# Patient Record
Sex: Male | Born: 1945
Health system: Southern US, Community
[De-identification: ages and names within clinical notes are randomized; demographics above are authoritative.]

## PROBLEM LIST (undated history)

## (undated) DIAGNOSIS — E119 Type 2 diabetes mellitus without complications: Secondary | ICD-10-CM

## (undated) DIAGNOSIS — I10 Essential (primary) hypertension: Secondary | ICD-10-CM

## (undated) DIAGNOSIS — E7800 Pure hypercholesterolemia, unspecified: Secondary | ICD-10-CM

---

## 2011-05-05 ENCOUNTER — Encounter: Payer: Self-pay | Admitting: Emergency Medicine

## 2011-05-05 ENCOUNTER — Emergency Department (HOSPITAL_BASED_OUTPATIENT_CLINIC_OR_DEPARTMENT_OTHER)
Admission: EM | Admit: 2011-05-05 | Discharge: 2011-05-06 | Disposition: A | Discharge status: Home / Self Care | Payer: Self-pay | Attending: Emergency Medicine | Admitting: Emergency Medicine

## 2011-05-05 DIAGNOSIS — X500XXA Overexertion from strenuous movement or load, initial encounter: Secondary | ICD-10-CM | POA: Insufficient documentation

## 2011-05-05 DIAGNOSIS — T63461A Toxic effect of venom of wasps, accidental (unintentional), initial encounter: Secondary | ICD-10-CM | POA: Insufficient documentation

## 2011-05-05 DIAGNOSIS — IMO0002 Reserved for concepts with insufficient information to code with codable children: Secondary | ICD-10-CM | POA: Insufficient documentation

## 2011-05-05 DIAGNOSIS — S39012A Strain of muscle, fascia and tendon of lower back, initial encounter: Secondary | ICD-10-CM

## 2011-05-05 DIAGNOSIS — T6391XA Toxic effect of contact with unspecified venomous animal, accidental (unintentional), initial encounter: Secondary | ICD-10-CM | POA: Insufficient documentation

## 2011-05-05 DIAGNOSIS — T63441A Toxic effect of venom of bees, accidental (unintentional), initial encounter: Secondary | ICD-10-CM

## 2011-05-05 MED ORDER — KETOROLAC TROMETHAMINE 60 MG/2ML IM SOLN
60.0000 mg | Freq: Once | INTRAMUSCULAR | Status: AC
  Administered 2011-05-05: 60 mg via INTRAMUSCULAR
  Filled 2011-05-05: qty 2

## 2011-05-05 NOTE — ED Provider Notes (Signed)
History     Chief Complaint  Patient presents with  . Back Pain  . Insect Bite   HPI Comments: Pt also states that he was stung by a bee yesterday on his stomach and it has been bothering him. It has been a little bit sore in his right lower abdomen and he's noticed some redness. He has not had any hives. He's not had any trouble with his breathing. He has no history of allergic reactions to bee stings in the past. He has not actually taken anything for his bee sting pain. He just got off work so he came to the ED.  The area on his stomach has been red.  No hives. Regarding his back pain, patient does have history of having problems with his back in the past. It has been years. He has not had any fevers or no focal deficits  Patient is a 65 y.o. male presenting with back pain. The history is provided by the patient.  Back Pain  The current episode started 6 to 12 hours ago. The problem occurs constantly. The problem has not changed since onset.The pain is associated with no known injury. The pain is present in the thoracic spine. The quality of the pain is described as stabbing. Radiates to: lower back. The pain is moderate. The symptoms are aggravated by bending, certain positions and twisting. Pertinent negatives include no chest pain, no fever, no numbness, no abdominal swelling, no dysuria, no paresthesias, no tingling and no weakness. He has tried nothing for the symptoms.    History reviewed. No pertinent past medical history.  History reviewed. No pertinent past surgical history.  History reviewed. No pertinent family history.  History  Substance Use Topics  . Smoking status: Current Everyday Smoker    Types: Cigars  . Smokeless tobacco: Not on file  . Alcohol Use: No      Review of Systems  Constitutional: Negative for fever.  Respiratory: Negative for apnea, cough, choking, chest tightness and shortness of breath.   Cardiovascular: Negative for chest pain.  Genitourinary:  Negative for dysuria.  Musculoskeletal: Positive for back pain.  Neurological: Negative for tingling, weakness, numbness and paresthesias.  All other systems reviewed and are negative.    Physical Exam  BP 130/63  Pulse 77  Temp(Src) 98.6 F (37 C) (Oral)  Resp 18  SpO2 97%  Physical Exam  Constitutional: He appears well-developed and well-nourished. No distress.  HENT:  Head: Normocephalic and atraumatic.  Right Ear: External ear normal.  Left Ear: External ear normal.  Eyes: Conjunctivae are normal. Right eye exhibits no discharge. Left eye exhibits no discharge. No scleral icterus.  Neck: Neck supple. No tracheal deviation present.  Cardiovascular: Normal rate, regular rhythm and intact distal pulses.   Pulmonary/Chest: Effort normal and breath sounds normal. No stridor. No respiratory distress. He has no wheezes. He has no rales.  Abdominal: Soft. Bowel sounds are normal. He exhibits no distension. There is no tenderness. There is no rebound and no guarding.  Musculoskeletal: He exhibits no edema.       Mild tenderness to palpation in the left paraspinal region of the thoracic and lumbar area. Is also some mild tenderness along the parascapular region. There is no erythema. No bony tenderness to palpation. Normal pulses in all extremities.  Neurological: He is alert. He has normal strength. No sensory deficit. Cranial nerve deficit:  no gross defecits noted. He exhibits normal muscle tone. He displays no seizure activity. Coordination normal.  Patient has normal strength sensation and sensation throughout his extremities.  Skin: Skin is warm and dry.       Small area of erythema in the right lower abdomen no lymphangitic streaking. No urticaria noted.  Psychiatric: He has a normal mood and affect.    ED Course  Procedures Injection of Toradol. MDM Patient with signs of localized inflammation from an insect sting. No evidence of allergic reaction or infection. He also  happens to have what appears to be musculoskeletal back pain. The pain increases with movement and palpation. There is no signs to suggest acute vascular emergency such as aortic dissection. Doubt cardiac or pulmonary etiology.      Celene Kras, MD 05/06/11 0001

## 2011-05-05 NOTE — ED Notes (Signed)
Pt c/o bee sting to RLQ abd yesterday. Mild swelling and redness to area noted. Pt has not taken any benadryl since incident. Pt also c/o back pain.

## 2011-05-06 MED ORDER — CYCLOBENZAPRINE HCL 10 MG PO TABS: 10.0000 mg | ORAL_TABLET | Freq: Two times a day (BID) | ORAL | Status: AC | PRN

## 2011-05-06 MED ORDER — NAPROXEN 500 MG PO TABS: 500.0000 mg | ORAL_TABLET | Freq: Two times a day (BID) | ORAL | Status: AC

## 2011-05-06 MED ORDER — DIPHENHYDRAMINE HCL 25 MG PO CAPS: 25.0000 mg | ORAL_CAPSULE | Freq: Four times a day (QID) | ORAL | Status: AC | PRN

## 2011-05-06 MED ORDER — HYDROCODONE-ACETAMINOPHEN 5-325 MG PO TABS: 1.0000 | ORAL_TABLET | Freq: Four times a day (QID) | ORAL | Status: AC | PRN

## 2014-11-15 ENCOUNTER — Emergency Department (HOSPITAL_BASED_OUTPATIENT_CLINIC_OR_DEPARTMENT_OTHER)
Admission: EM | Admit: 2014-11-15 | Discharge: 2014-11-15 | Disposition: A | Discharge status: Home / Self Care | Payer: Medicare Other | Attending: Emergency Medicine | Admitting: Emergency Medicine

## 2014-11-15 ENCOUNTER — Encounter (HOSPITAL_BASED_OUTPATIENT_CLINIC_OR_DEPARTMENT_OTHER): Payer: Self-pay | Admitting: *Deleted

## 2014-11-15 DIAGNOSIS — Z720 Tobacco use: Secondary | ICD-10-CM | POA: Insufficient documentation

## 2014-11-15 DIAGNOSIS — J029 Acute pharyngitis, unspecified: Secondary | ICD-10-CM | POA: Diagnosis present

## 2014-11-15 DIAGNOSIS — I10 Essential (primary) hypertension: Secondary | ICD-10-CM | POA: Insufficient documentation

## 2014-11-15 HISTORY — DX: Essential (primary) hypertension: I10

## 2014-11-15 LAB — RAPID STREP SCREEN (MED CTR MEBANE ONLY): STREPTOCOCCUS, GROUP A SCREEN (DIRECT): NEGATIVE

## 2014-11-15 MED ORDER — PREDNISONE 50 MG PO TABS: 50.0000 mg | ORAL_TABLET | Freq: Every day | ORAL | Status: AC

## 2014-11-15 MED ORDER — HYDROCODONE-ACETAMINOPHEN 7.5-325 MG/15ML PO SOLN: 15.0000 mL | ORAL | Status: AC | PRN

## 2014-11-15 MED ORDER — PENICILLIN V POTASSIUM 500 MG PO TABS: 500.0000 mg | ORAL_TABLET | Freq: Three times a day (TID) | ORAL | Status: AC

## 2014-11-15 NOTE — ED Provider Notes (Signed)
CSN: [id number]     Arrival date & time 11/15/14  1346 History   First MD Initiated Contact with Patient 11/15/14 1418     Chief Complaint  Patient presents with  . Sore Throat     (Consider location/radiation/quality/duration/timing/severity/associated sxs/prior Treatment) HPI  Pt presenting with c/o sore throat.  Pt states symptoms started last night at 2am.  He states he has also had some runny nose and mild cough.  No fever/chills.  He has had no difficulty swallowing or breathing.  He has pain with swallowing.  He states some coworkers have had URI symptoms.  There are no other associated systemic symptoms, there are no other alleviating or modifying factors.   Past Medical History  Diagnosis Date  . Hypertension    History reviewed. No pertinent past surgical history. No family history on file. History  Substance Use Topics  . Smoking status: Current Some Day Smoker    Types: Cigars  . Smokeless tobacco: Never Used  . Alcohol Use: Yes     Comment: occasional    Review of Systems  ROS reviewed and all otherwise negative except for mentioned in HPI    Allergies  Review of patient's allergies indicates no known allergies.  Home Medications   Prior to Admission medications   Medication Sig Start Date End Date Taking? Authorizing Provider  HYDROcodone-acetaminophen (HYCET) 7.5-325 mg/15 ml solution Take 15 mLs by mouth every 4 (four) hours as needed for moderate pain or severe pain. 11/15/14   Ethelda ChickMartha K Linker, MD  penicillin v potassium (VEETID) 500 MG tablet Take 1 tablet (500 mg total) by mouth 3 (three) times daily. 11/15/14   Ethelda ChickMartha K Linker, MD  predniSONE (DELTASONE) 50 MG tablet Take 1 tablet (50 mg total) by mouth daily. 11/15/14   Ethelda ChickMartha K Linker, MD   BP 147/69 mmHg  Pulse 79  Temp(Src) 98.5 F (36.9 C) (Oral)  Resp 16  Ht 5\' 8"  (1.727 m)  Wt 187 lb (84.823 kg)  BMI 28.44 kg/m2  SpO2 99%  Vitals reviewed Physical Exam  Physical Examination: General  appearance - alert, well appearing, and in no distress Mental status - alert, oriented to person, place, and time Eyes - no conjunctival injection, no scleral icterus Mouth - OP with moderate erythema, no exudate, palate symmetric, uvula midline Chest - clear to auscultation, no wheezes, rales or rhonchi, symmetric air entry Heart - normal rate, regular rhythm, normal S1, S2, no murmurs, rubs, clicks or gallops Abdomen - soft, nontender, nondistended, no masses or organomegaly Extremities - peripheral pulses normal, no pedal edema, no clubbing or cyanosis Skin - normal coloration and turgor, no rashes  ED Course  Procedures (including critical care time) Labs Review Labs Reviewed  RAPID STREP SCREEN  CULTURE, GROUP A STREP    Imaging Review No results found.   EKG Interpretation None      MDM   Final diagnoses:  Pharyngitis   Pt presenting with c/o sore throat, runny nose and mild cough.  He has some hot potato voice and signficant pain on exam.  Will treat with penicillin- rapid strep negative, throat culture pending.  Also added steroids, given rx for hycet as well.   Patient is overall nontoxic and well hydrated in appearance.  Discharged with strict return precautions.  Pt agreeable with plan.     Ethelda ChickMartha K Linker, MD 11/15/14 [phone]

## 2014-11-15 NOTE — ED Notes (Signed)
Sore throat x 1 day 

## 2014-11-15 NOTE — Discharge Instructions (Signed)
Return to the ED with any concerns including difficulty breathing or swallowing, fever/chills, vomiting and not able to keep down liquids or antibiotics, decreased level of alertness/lethargy, or any other alarming symptoms

## 2014-11-17 LAB — CULTURE, GROUP A STREP

## 2015-01-26 ENCOUNTER — Emergency Department (HOSPITAL_BASED_OUTPATIENT_CLINIC_OR_DEPARTMENT_OTHER)
Admission: EM | Admit: 2015-01-26 | Discharge: 2015-01-26 | Disposition: A | Discharge status: Home / Self Care | Payer: Medicare Other | Attending: Emergency Medicine | Admitting: Emergency Medicine

## 2015-01-26 ENCOUNTER — Emergency Department (HOSPITAL_BASED_OUTPATIENT_CLINIC_OR_DEPARTMENT_OTHER): Payer: Medicare Other

## 2015-01-26 ENCOUNTER — Encounter (HOSPITAL_BASED_OUTPATIENT_CLINIC_OR_DEPARTMENT_OTHER): Payer: Self-pay | Admitting: *Deleted

## 2015-01-26 DIAGNOSIS — Z792 Long term (current) use of antibiotics: Secondary | ICD-10-CM | POA: Diagnosis not present

## 2015-01-26 DIAGNOSIS — R0789 Other chest pain: Secondary | ICD-10-CM | POA: Diagnosis not present

## 2015-01-26 DIAGNOSIS — Z79899 Other long term (current) drug therapy: Secondary | ICD-10-CM | POA: Diagnosis not present

## 2015-01-26 DIAGNOSIS — R079 Chest pain, unspecified: Secondary | ICD-10-CM | POA: Insufficient documentation

## 2015-01-26 DIAGNOSIS — M25512 Pain in left shoulder: Secondary | ICD-10-CM | POA: Diagnosis not present

## 2015-01-26 DIAGNOSIS — R21 Rash and other nonspecific skin eruption: Secondary | ICD-10-CM | POA: Diagnosis not present

## 2015-01-26 DIAGNOSIS — Z7952 Long term (current) use of systemic steroids: Secondary | ICD-10-CM | POA: Insufficient documentation

## 2015-01-26 DIAGNOSIS — I10 Essential (primary) hypertension: Secondary | ICD-10-CM | POA: Diagnosis not present

## 2015-01-26 LAB — BASIC METABOLIC PANEL
ANION GAP: 7 (ref 5–15)
BUN: 18 mg/dL (ref 6–23)
CALCIUM: 9.1 mg/dL (ref 8.4–10.5)
CO2: 28 mmol/L (ref 19–32)
Chloride: 102 mmol/L (ref 96–112)
Creatinine, Ser: 1.06 mg/dL (ref 0.50–1.35)
GFR, EST AFRICAN AMERICAN: 82 mL/min — AB (ref >90)
GFR, EST NON AFRICAN AMERICAN: 71 mL/min — AB (ref >90)
Glucose, Bld: 104 mg/dL — ABNORMAL HIGH (ref 70–99)
POTASSIUM: 3.9 mmol/L (ref 3.5–5.1)
SODIUM: 137 mmol/L (ref 135–145)

## 2015-01-26 LAB — CBC
HEMATOCRIT: 39.8 % (ref 39.0–52.0)
HEMOGLOBIN: 14 g/dL (ref 13.0–17.0)
MCH: 27.7 pg (ref 26.0–34.0)
MCHC: 35.2 g/dL (ref 30.0–36.0)
MCV: 78.8 fL (ref 78.0–100.0)
Platelets: 226 10*3/uL (ref 150–400)
RBC: 5.05 MIL/uL (ref 4.22–5.81)
RDW: 14.7 % (ref 11.5–15.5)
WBC: 7.2 10*3/uL (ref 4.0–10.5)

## 2015-01-26 LAB — TROPONIN I: Troponin I: <0.03 ng/mL (ref <0.031)

## 2015-01-26 MED ORDER — ASPIRIN 81 MG PO CHEW
324.0000 mg | CHEWABLE_TABLET | Freq: Once | ORAL | Status: AC
  Administered 2015-01-26: 324 mg via ORAL
  Filled 2015-01-26: qty 4

## 2015-01-26 MED ORDER — HYDROCODONE-ACETAMINOPHEN 5-325 MG PO TABS: 1.0000 | ORAL_TABLET | Freq: Four times a day (QID) | ORAL | Status: AC | PRN

## 2015-01-26 NOTE — ED Provider Notes (Signed)
CSN: [id number]     Arrival date & time 01/26/15  1526 History  This chart was scribed for Vanetta MuldersScott Markeese Boyajian, MD by Tonye RoyaltyJoshua Chen, ED Scribe. This patient was seen in room MH06/MH06 and the patient's care was started at 4:15 PM.    Chief Complaint  Patient presents with  . Chest Pain   Patient is a 69 y.o. male presenting with chest pain. The history is provided by the patient. No language interpreter was used.  Chest Pain Pain location:  L chest Pain quality: aching   Pain radiates to:  L shoulder Pain radiates to the back: no   Pain severity:  Moderate Onset quality:  Sudden Duration:  8 hours Timing:  Constant Progression:  Unchanged Chronicity:  New Context: at rest   Relieved by:  None tried Exacerbated by: movement of left arm. Ineffective treatments:  None tried Associated symptoms: back pain (baseline)   Associated symptoms: no abdominal pain, no cough, no fever, no headache, no nausea, no shortness of breath and not vomiting   Risk factors: hypertension and male sex     HPI Comments: Sharene ButtersJerry Stennis is a 69 y.o. male with history of HTN who presents to the Emergency Department complaining of left-sided chest pain radiating to left shoulder with onset at 0830 this morning. He states pain has been constant since it began. He describes the pain as aching and rates it at 7.5/10, 8.5/10 at worst. He states pain is worse with movement of left arm, especially when lifting the arm or using steering wheel. He denies any discomfort yesterday. 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 He states that a few weeks ago, he was having intermittent left-sided chest pain "in his heart" that would resolve spontaneously, he did not see his doctor for this. He states today's pain feels "totally different." He states pain at that time did not change with movement of his arm. He reports history of cardiac problems but denies prior MI. He uses baby  ASA every day and also uses HTN medication. He denise SOB, nausea, or vomiting.  PCP: Patria ManeGASSEMI, MIKE, MD with [facility]  Past Medical History  Diagnosis Date  . Hypertension    History reviewed. No pertinent past surgical history. No family history on file. History  Substance Use Topics  . Smoking status: Current Some Day Smoker    Types: Cigars  . Smokeless tobacco: Never Used  . Alcohol Use: Yes     Comment: occasional    Review of Systems  Constitutional: Negative for fever and chills.  HENT: Negative for rhinorrhea and sore throat.   Eyes: Negative for visual disturbance.  Respiratory: Negative for cough and shortness of breath.   Cardiovascular: Positive for chest pain. Negative for leg swelling.  Gastrointestinal: Negative for nausea, vomiting, abdominal pain and diarrhea.  Genitourinary: Negative for dysuria.  Musculoskeletal: Positive for back pain (baseline).  Skin: Positive for rash (evaluated by PCP and prescribed a cream).  Neurological: Negative for headaches.  Hematological: Does not bruise/bleed easily.  Psychiatric/Behavioral: Negative for confusion.      Allergies  Review of patient's allergies indicates no known allergies.  Home Medications   Prior to Admission medications   Medication Sig Start Date End Date Taking? Authorizing Provider  oxaprozin (DAYPRO) 600 MG tablet Take by mouth daily.   Yes Historical Provider, MD  HYDROcodone-acetaminophen (HYCET) 7.5-325 mg/15 ml solution Take 15 mLs by mouth every 4 (four)  hours as needed for moderate pain or severe pain. 11/15/14   Jerelyn Armie Moren, MD  HYDROcodone-acetaminophen (NORCO/VICODIN) 5-325 MG per tablet Take 1-2 tablets by mouth every 6 (six) hours as needed for moderate pain. 01/26/15   Vanetta Mulders, MD  penicillin v potassium (VEETID) 500 MG tablet Take 1 tablet (500 mg total) by mouth 3 (three) times daily. 11/15/14   Jerelyn Jaxyn Mestas, MD  predniSONE (DELTASONE) 50 MG tablet Take 1 tablet  (50 mg total) by mouth daily. 11/15/14   Jerelyn Korey Prashad, MD   BP 119/60 mmHg  Pulse 67  Temp(Src) 98.6 F (37 C) (Oral)  Resp 13  Ht  (1.727 m)  Wt 185 lb (83.915 kg)  BMI 28.14 kg/m2  SpO2 98% Physical Exam  Constitutional: He is oriented to person, place, and time. He appears well-developed and well-nourished.  HENT:  Head: Normocephalic and atraumatic.  Moist mucous membranes  Eyes: Conjunctivae and EOM are normal. Pupils are equal, round, and reactive to light. No scleral icterus.  Neck: Normal range of motion. Neck supple.  Cardiovascular: Normal rate and regular rhythm.   Pulses:      Radial pulses are 2+ on the left side.  Pulmonary/Chest: Effort normal and breath sounds normal. No respiratory distress. He has no wheezes. He has no rales.  Lungs clear bilaterally  Abdominal: Soft. Bowel sounds are normal. There is no tenderness.  Musculoskeletal: Normal range of motion. He exhibits no edema (no swelling in ankles).  Pain with manipulation of left arm  Neurological: He is alert and oriented to person, place, and time. No cranial nerve deficit. He exhibits normal muscle tone. Coordination normal.  Skin: Skin is warm and dry.  Psychiatric: He has a normal mood and affect.  Nursing note and vitals reviewed.   ED Course  Procedures (including critical care time)  DIAGNOSTIC STUDIES: Oxygen Saturation is 99% on room air, normal by my interpretation.    COORDINATION OF CARE: 4:28 PM Discussed treatment plan with patient at beside, including cardiac workup and x-ray of shoulder. The patient agrees with the plan and has no further questions at this time.   Labs Review Labs Reviewed  BASIC METABOLIC PANEL - Abnormal; Notable for the following:    Glucose, Bld 104 (*)    GFR calc non Af Amer 71 (*)    GFR calc Af Amer 82 (*)    All other components within normal limits  CBC  TROPONIN I   Results for orders placed or performed during the hospital encounter of  01/26/15  CBC  Result Value Ref Range   WBC 7.2 4.0 - 10.5 K/uL   RBC 5.05 4.22 - 5.81 MIL/uL   Hemoglobin 14.0 13.0 - 17.0 g/dL   HCT 16.1 09.6 - 04.5 %   MCV 78.8 78.0 - 100.0 fL   MCH 27.7 26.0 - 34.0 pg   MCHC 35.2 30.0 - 36.0 g/dL   RDW 40.9 81.1 - 91.4 %   Platelets 226 150 - 400 K/uL  Basic metabolic panel  Result Value Ref Range   Sodium 137 135 - 145 mmol/L   Potassium 3.9 3.5 - 5.1 mmol/L   Chloride 102 96 - 112 mmol/L   CO2 28 19 - 32 mmol/L   Glucose, Bld 104 (H) 70 - 99 mg/dL   BUN 18 6 - 23 mg/dL   Creatinine, Ser 7.82 0.50 - 1.35 mg/dL   Calcium 9.1 8.4 - 95.6 mg/dL   GFR calc non Af Amer 71 (L) >90  mL/min   GFR calc Af Amer 82 (L) >90 mL/min   Anion gap 7 5 - 15  Troponin I ([facility])  Result Value Ref Range   Troponin I <0.03 <0.031 ng/mL     Imaging Review Dg Chest 2 View  01/26/2015   CLINICAL DATA:  LEFT side chest and shoulder pain  EXAM: CHEST  2 VIEW  COMPARISON:  None  FINDINGS: Normal heart size, mediastinal contours and pulmonary vascularity.  Lungs clear.  No pleural effusion or pneumothorax.  Scattered endplate spur formation lower thoracic spine.  IMPRESSION: No acute abnormalities.   Electronically Signed   By: Ulyses Southward M.D.   On: 01/26/2015 16:52   Dg Shoulder Left  01/26/2015   CLINICAL DATA:  Left-sided chest pain and shoulder pain without trauma. Initial encounter.  EXAM: LEFT SHOULDER - 2+ VIEW  COMPARISON:  None.  FINDINGS: Degenerative irregularity involves the acromioclavicular joint, moderate. Visualized portion of the left hemithorax is normal. No acute fracture or dislocation.  IMPRESSION: Degenerative change, without acute osseous finding.   Electronically Signed   By: Jeronimo Greaves M.D.   On: 01/26/2015 16:53     EKG Interpretation   Date/Time:  Monday January 26 2015 15:34:23 EDT Ventricular Rate:  61 PR Interval:  176 QRS Duration: 150 QT Interval:  408 QTC Calculation: 410 R Axis:   -80 Text Interpretation:  Normal sinus rhythm  Left axis deviation Right bundle  branch block Abnormal ECG No previous ECGs available Confirmed by  Coralynn Gaona  MD, Toney Difatta [phone]) on 01/26/2015 3:59:56 PM      MDM   Final diagnoses:  Shoulder pain, acute, left    Patient with the significant discomfort in the left shoulder with range of motion increased pain when trying to raise his left arm above his head. This we consistent with some sort of rotator cuff type injury. Does not seem to be cardiac in nature. Patient week ago had some on and off chest pain troponin was negative today also today's chest pain started about 8:30 in the morning. As stated this is not consistent with acute cardiac pain. More of rotator cuff type injury. Patient treated with sling follow-up sports medicine. Patient will continue baby aspirin a day.  Patient will return for any new or worse symptoms.  I personally performed the services described in this documentation, which was scribed in my presence. The recorded information has been reviewed and is accurate.    Vanetta Mulders, MD 01/26/15 1724

## 2015-01-26 NOTE — ED Notes (Signed)
Left sided chest pain. Aching in his left arm.

## 2015-01-26 NOTE — Discharge Instructions (Signed)
Symptoms seem to be consistent with a rotator cuff injury to the left shoulder. Does not seem to be consistent with cardiac chest pain. Follow-up of with sports medicine upstairs information provided. Take pain medicine as needed. Use sling for comfort. Return for any new or worse symptoms. Continue take an baby aspirin a day.

## 2015-01-26 NOTE — ED Notes (Signed)
Will apply sling once Pt is ready to be discharged and RN Marva informed

## 2016-02-04 ENCOUNTER — Emergency Department (HOSPITAL_BASED_OUTPATIENT_CLINIC_OR_DEPARTMENT_OTHER)
Admission: EM | Admit: 2016-02-04 | Discharge: 2016-02-05 | Disposition: A | Discharge status: Home / Self Care | Payer: Medicare Other | Attending: Emergency Medicine | Admitting: Emergency Medicine

## 2016-02-04 ENCOUNTER — Encounter (HOSPITAL_BASED_OUTPATIENT_CLINIC_OR_DEPARTMENT_OTHER): Payer: Self-pay | Admitting: *Deleted

## 2016-02-04 ENCOUNTER — Emergency Department (HOSPITAL_BASED_OUTPATIENT_CLINIC_OR_DEPARTMENT_OTHER): Payer: Medicare Other

## 2016-02-04 DIAGNOSIS — F17210 Nicotine dependence, cigarettes, uncomplicated: Secondary | ICD-10-CM | POA: Diagnosis not present

## 2016-02-04 DIAGNOSIS — Z79899 Other long term (current) drug therapy: Secondary | ICD-10-CM | POA: Insufficient documentation

## 2016-02-04 DIAGNOSIS — I10 Essential (primary) hypertension: Secondary | ICD-10-CM | POA: Diagnosis not present

## 2016-02-04 DIAGNOSIS — Z792 Long term (current) use of antibiotics: Secondary | ICD-10-CM | POA: Insufficient documentation

## 2016-02-04 DIAGNOSIS — Z7952 Long term (current) use of systemic steroids: Secondary | ICD-10-CM | POA: Diagnosis not present

## 2016-02-04 DIAGNOSIS — K5900 Constipation, unspecified: Secondary | ICD-10-CM | POA: Insufficient documentation

## 2016-02-04 NOTE — ED Notes (Signed)
C/o constipation since Tuesday  States has had some small bm,

## 2016-02-04 NOTE — ED Notes (Signed)
Constipation. Abdominal pain. He took a laxative earlier today with no relief.

## 2016-02-05 MED ORDER — FLEET ENEMA 7-19 GM/118ML RE ENEM: 1.0000 | ENEMA | Freq: Once | RECTAL | Status: AC

## 2016-02-05 MED ORDER — GLYCERIN (LAXATIVE) 2.1 G RE SUPP
1.0000 | Freq: Once | RECTAL | Status: AC
  Administered 2016-02-05: 1 via RECTAL
  Filled 2016-02-05: qty 1

## 2016-02-05 MED ORDER — POLYETHYLENE GLYCOL 3350 17 G PO PACK: 17.0000 g | PACK | Freq: Three times a day (TID) | ORAL | Status: AC | PRN

## 2016-02-05 NOTE — Discharge Instructions (Signed)
Constipation, Adult °Constipation is when a person has fewer than three bowel movements a week, has difficulty having a bowel movement, or has stools that are dry, hard, or larger than normal. As people grow older, constipation is more common. A low-fiber diet, not taking in enough fluids, and taking certain medicines may make constipation worse.  °CAUSES  °· Certain medicines, such as antidepressants, pain medicine, iron supplements, antacids, and water pills.   °· Certain diseases, such as diabetes, irritable bowel syndrome (IBS), thyroid disease, or depression.   °· Not drinking enough water.   °· Not eating enough fiber-rich foods.   °· Stress or travel.   °· Lack of physical activity or exercise.   °· Ignoring the urge to have a bowel movement.   °· Using laxatives too much.   °SIGNS AND SYMPTOMS  °· Having fewer than three bowel movements a week.   °· Straining to have a bowel movement.   °· Having stools that are hard, dry, or larger than normal.   °· Feeling full or bloated.   °· Pain in the lower abdomen.   °· Not feeling relief after having a bowel movement.   °DIAGNOSIS  °Your health care provider will take a medical history and perform a physical exam. Further testing may be done for severe constipation. Some tests may include: °· A barium enema X-ray to examine your rectum, colon, and, sometimes, your small intestine.   °· A sigmoidoscopy to examine your lower colon.   °· A colonoscopy to examine your entire colon. °TREATMENT  °Treatment will depend on the severity of your constipation and what is causing it. Some dietary treatments include drinking more fluids and eating more fiber-rich foods. Lifestyle treatments may include regular exercise. If these diet and lifestyle recommendations do not help, your health care provider may recommend taking over-the-counter laxative medicines to help you have bowel movements. Prescription medicines may be prescribed if over-the-counter medicines do not work.    °HOME CARE INSTRUCTIONS  °· Eat foods that have a lot of fiber, such as fruits, vegetables, whole grains, and beans. °· Limit foods high in fat and processed sugars, such as french fries, hamburgers, cookies, candies, and soda.   °· A fiber supplement may be added to your diet if you cannot get enough fiber from foods.   °· Drink enough fluids to keep your urine clear or pale yellow.   °· Exercise regularly or as directed by your health care provider.   °· Go to the restroom when you have the urge to go. Do not hold it.   °· Only take over-the-counter or prescription medicines as directed by your health care provider. Do not take other medicines for constipation without talking to your health care provider first.   °SEEK IMMEDIATE MEDICAL CARE IF:  °· You have bright red blood in your stool.   °· Your constipation lasts for more than 4 days or gets worse.   °· You have abdominal or rectal pain.   °· You have thin, pencil-like stools.   °· You have unexplained weight loss. °MAKE SURE YOU:  °· Understand these instructions. °· Will watch your condition. °· Will get help right away if you are not doing well or get worse. °  °This information is not intended to replace advice given to you by your health care provider. Make sure you discuss any questions you have with your health care provider. °  °Document Released: 07/01/2004 Document Revised: 10/24/2014 Document Reviewed: 07/15/2013 °Elsevier Interactive Patient Education ©2016 [employer] ° °High-Fiber Diet °Fiber, also called dietary fiber, is a type of carbohydrate found in fruits, vegetables, whole grains, and   beans. A high-fiber diet can have many health benefits. Your health care provider may recommend a high-fiber diet to help: °· Prevent constipation. Fiber can make your bowel movements more regular. °· Lower your cholesterol. °· Relieve hemorrhoids, uncomplicated diverticulosis, or irritable bowel syndrome. °· Prevent overeating as part of a weight-loss  plan. °· Prevent heart disease, type 2 diabetes, and certain cancers. °WHAT IS MY PLAN? °The recommended daily intake of fiber includes: °· 38 grams for men under age 50. °· 30 grams for men over age 50. °· 25 grams for women under age 50. °· 21 grams for women over age 50. °You can get the recommended daily intake of dietary fiber by eating a variety of fruits, vegetables, grains, and beans. Your health care provider may also recommend a fiber supplement if it is not possible to get enough fiber through your diet. °WHAT DO I NEED TO KNOW ABOUT A HIGH-FIBER DIET? °· Fiber supplements have not been widely studied for their effectiveness, so it is better to get fiber through food sources. °· Always check the fiber content on the nutrition facts label of any prepackaged food. Look for foods that contain at least 5 grams of fiber per serving. °· Ask your dietitian if you have questions about specific foods that are related to your condition, especially if those foods are not listed in the following section. °· Increase your daily fiber consumption gradually. Increasing your intake of dietary fiber too quickly may cause bloating, cramping, or gas. °· Drink plenty of water. Water helps you to digest fiber. °WHAT FOODS CAN I EAT? °Grains °Whole-grain breads. Multigrain cereal. Oats and oatmeal. Brown rice. Barley. Bulgur wheat. Millet. Bran muffins. Popcorn. Rye wafer crackers. °Vegetables °Sweet potatoes. Spinach. Kale. Artichokes. Cabbage. Broccoli. Green peas. Carrots. Squash. °Fruits °Berries. Pears. Apples. Oranges. Avocados. Prunes and raisins. Dried figs. °Meats and Other Protein Sources °Navy, kidney, pinto, and soy beans. Split peas. Lentils. Nuts and seeds. °Dairy °Fiber-fortified yogurt. °Beverages °Fiber-fortified soy milk. Fiber-fortified orange juice. °Other °Fiber bars. °The items listed above may not be a complete list of recommended foods or beverages. Contact your dietitian for more options. °WHAT FOODS  ARE NOT RECOMMENDED? °Grains °White bread. Pasta made with refined flour. White rice. °Vegetables °Fried potatoes. Canned vegetables. Well-cooked vegetables.  °Fruits °Fruit juice. Cooked, strained fruit. °Meats and Other Protein Sources °Fatty cuts of meat. Fried poultry or fried fish. °Dairy °Milk. Yogurt. Cream cheese. Sour cream. °Beverages °Soft drinks. °Other °Cakes and pastries. Butter and oils. °The items listed above may not be a complete list of foods and beverages to avoid. Contact your dietitian for more information. °WHAT ARE SOME TIPS FOR INCLUDING HIGH-FIBER FOODS IN MY DIET? °· Eat a wide variety of high-fiber foods. °· Make sure that half of all grains consumed each day are whole grains. °· Replace breads and cereals made from refined flour or white flour with whole-grain breads and cereals. °· Replace white rice with brown rice, bulgur wheat, or millet. °· Start the day with a breakfast that is high in fiber, such as a cereal that contains at least 5 grams of fiber per serving. °· Use beans in place of meat in soups, salads, or pasta. °· Eat high-fiber snacks, such as berries, raw vegetables, nuts, or popcorn. °  °This information is not intended to replace advice given to you by your health care provider. Make sure you discuss any questions you have with your health care provider. °  °Document Released: 10/03/2005 Document Revised: 10/24/2014 Document   Reviewed: 03/18/2014 °Elsevier Interactive Patient Education ©2016 [employer]. ° °

## 2016-02-05 NOTE — ED Provider Notes (Signed)
CSN: [id number]     Arrival date & time 02/04/16  2227 History   First MD Initiated Contact with Patient 02/04/16 2323     Chief Complaint  Patient presents with  . Constipation     (Consider location/radiation/quality/duration/timing/severity/associated sxs/prior Treatment) HPI  Ronald Adams Is a 70 year old male who presents emergency Department with chief complaint of constipation. Patient states that he has not made a bowel movement in the past 3 days. He complains of urgency to defecate but denies severe abdominal pain. He states that he has been making very small bowel movements. He took mag citrate around 1 PM today without successful bowel movement. Patient states he also drank milk. She states "runs right through me." Denies history of abdominal surgeries. She denies nausea, vomiting. He denies fevers or chills. Denies urinary symptoms, chest pain or shortness of breath. Past Medical History  Diagnosis Date  . Hypertension    History reviewed. No pertinent past surgical history. No family history on file. Social History  Substance Use Topics  . Smoking status: Current Some Day Smoker    Types: Cigars  . Smokeless tobacco: Never Used  . Alcohol Use: Yes     Comment: occasional    Review of Systems  Ten systems reviewed and are negative for acute change, except as noted in the HPI.    Allergies  Review of patient's allergies indicates no known allergies.  Home Medications   Prior to Admission medications   Medication Sig Start Date End Date Taking? Authorizing Provider  HYDROcodone-acetaminophen (HYCET) 7.5-325 mg/15 ml solution Take 15 mLs by mouth every 4 (four) hours as needed for moderate pain or severe pain. 11/15/14   Jerelyn ScottMartha Linker, MD  HYDROcodone-acetaminophen (NORCO/VICODIN) 5-325 MG per tablet Take 1-2 tablets by mouth every 6 (six) hours as needed for moderate pain. 01/26/15   Vanetta MuldersScott Zackowski, MD  oxaprozin (DAYPRO) 600 MG tablet Take by mouth daily.     Historical Provider, MD  penicillin v potassium (VEETID) 500 MG tablet Take 1 tablet (500 mg total) by mouth 3 (three) times daily. 11/15/14   Jerelyn ScottMartha Linker, MD  predniSONE (DELTASONE) 50 MG tablet Take 1 tablet (50 mg total) by mouth daily. 11/15/14   Jerelyn ScottMartha Linker, MD   BP 147/81 mmHg  Pulse 84  Temp(Src) 98.7 F (37.1 C) (Oral)  Resp 20  Ht 5\' 8"  (1.727 m)  Wt 83.915 kg  BMI 28.14 kg/m2  SpO2 98% Physical Exam Physical Exam  Nursing note and vitals reviewed. Constitutional: He appears well-developed and well-nourished. No distress.  HENT:  Head: Normocephalic and atraumatic.  Eyes: Conjunctivae normal are normal. No scleral icterus.  Neck: Normal range of motion. Neck supple.  Cardiovascular: Normal rate, regular rhythm and normal heart sounds.   Pulmonary/Chest: Effort normal and breath sounds normal. No respiratory distress.  Abdominal: Soft. There is no tenderness.  Musculoskeletal: He exhibits no edema.  Neurological: He is alert.  Skin: Skin is warm and dry. He is not diaphoretic.  Psychiatric: His behavior is normal.  Rectal: Digital Rectal Exam reveals sphincter with good tone. No external hemorrhoids. No masses or fissures. Stool color is brown with no overt blood. Stool is felt in the rectal vault and is soft. No hard stool balls are palpated. No evidence of fecal impaction   ED Course  Procedures (including critical care time) Labs Review Labs Reviewed - No data to display  Imaging Review Dg Abd 1 View  02/04/2016  CLINICAL DATA:  Constipation for 2 days. EXAM: ABDOMEN -  1 VIEW COMPARISON:  None. FINDINGS: If there is formed stool in the rectum and distal colon although not overtly abnormal. Gas is present in the ascending and transverse colon. No dilated small bowel. Several vascular calcifications are present in the pelvis. Transitional S1 vertebra with lumbar spondylosis and spondylosis at the S1- 2 level. IMPRESSION: 1. There is formed stool in the distal half of  the colon but not to such an extent as to be considered excessive. 2. Lumbar spondylosis. Electronically Signed   By: Gaylyn Rong M.D.   On: 02/04/2016 23:47   I have personally reviewed and evaluated these images and lab results as part of my medical decision-making.   EKG Interpretation None      MDM   Final diagnoses:  None    Patient with constipation, no signs of impaction. Given glycerine suppository and currently using th bathroom.    Patient able to make a BM in the ED. Will d.c  With miralax. F/u with pcp.     Arthor Captain, PA-C 02/06/16 2333  Cy Blamer, MD 02/09/16 2330

## 2018-06-01 ENCOUNTER — Other Ambulatory Visit: Payer: Self-pay

## 2018-06-01 ENCOUNTER — Emergency Department (HOSPITAL_BASED_OUTPATIENT_CLINIC_OR_DEPARTMENT_OTHER): Payer: Medicare HMO

## 2018-06-01 ENCOUNTER — Encounter (HOSPITAL_BASED_OUTPATIENT_CLINIC_OR_DEPARTMENT_OTHER): Payer: Self-pay | Admitting: *Deleted

## 2018-06-01 ENCOUNTER — Emergency Department (HOSPITAL_BASED_OUTPATIENT_CLINIC_OR_DEPARTMENT_OTHER)
Admission: EM | Admit: 2018-06-01 | Discharge: 2018-06-01 | Disposition: A | Discharge status: Home / Self Care | Payer: Medicare HMO | Attending: Emergency Medicine | Admitting: Emergency Medicine

## 2018-06-01 DIAGNOSIS — Z87891 Personal history of nicotine dependence: Secondary | ICD-10-CM | POA: Diagnosis not present

## 2018-06-01 DIAGNOSIS — Z79899 Other long term (current) drug therapy: Secondary | ICD-10-CM | POA: Diagnosis not present

## 2018-06-01 DIAGNOSIS — M25512 Pain in left shoulder: Secondary | ICD-10-CM | POA: Diagnosis not present

## 2018-06-01 DIAGNOSIS — R0789 Other chest pain: Secondary | ICD-10-CM | POA: Insufficient documentation

## 2018-06-01 DIAGNOSIS — I10 Essential (primary) hypertension: Secondary | ICD-10-CM | POA: Diagnosis not present

## 2018-06-01 DIAGNOSIS — G8929 Other chronic pain: Secondary | ICD-10-CM | POA: Diagnosis not present

## 2018-06-01 LAB — COMPREHENSIVE METABOLIC PANEL
ALK PHOS: 88 U/L (ref 38–126)
ALT: 22 U/L (ref 0–44)
ANION GAP: 11 (ref 5–15)
AST: 30 U/L (ref 15–41)
Albumin: 4.2 g/dL (ref 3.5–5.0)
BUN: 11 mg/dL (ref 8–23)
CO2: 25 mmol/L (ref 22–32)
CREATININE: 0.95 mg/dL (ref 0.61–1.24)
Calcium: 8.8 mg/dL — ABNORMAL LOW (ref 8.9–10.3)
Chloride: 100 mmol/L (ref 98–111)
Glucose, Bld: 105 mg/dL — ABNORMAL HIGH (ref 70–99)
Potassium: 4.2 mmol/L (ref 3.5–5.1)
SODIUM: 136 mmol/L (ref 135–145)
TOTAL PROTEIN: 8 g/dL (ref 6.5–8.1)
Total Bilirubin: 0.7 mg/dL (ref 0.3–1.2)

## 2018-06-01 LAB — CBC WITH DIFFERENTIAL/PLATELET
Basophils Absolute: 0.1 10*3/uL (ref 0.0–0.1)
Basophils Relative: 1 %
EOS ABS: 0.3 10*3/uL (ref 0.0–0.7)
EOS PCT: 4 %
HCT: 40.2 % (ref 39.0–52.0)
Hemoglobin: 14.4 g/dL (ref 13.0–17.0)
LYMPHS ABS: 3.2 10*3/uL (ref 0.7–4.0)
LYMPHS PCT: 46 %
MCH: 27.6 pg (ref 26.0–34.0)
MCHC: 35.8 g/dL (ref 30.0–36.0)
MCV: 77.2 fL — AB (ref 78.0–100.0)
MONOS PCT: 10 %
Monocytes Absolute: 0.7 10*3/uL (ref 0.1–1.0)
Neutro Abs: 2.7 10*3/uL (ref 1.7–7.7)
Neutrophils Relative %: 39 %
Platelets: 247 10*3/uL (ref 150–400)
RBC: 5.21 MIL/uL (ref 4.22–5.81)
RDW: 14.5 % (ref 11.5–15.5)
WBC: 6.9 10*3/uL (ref 4.0–10.5)

## 2018-06-01 LAB — TROPONIN I

## 2018-06-01 NOTE — ED Notes (Signed)
Patient transported to X-ray 

## 2018-06-01 NOTE — ED Notes (Signed)
ED Provider at bedside. 

## 2018-06-01 NOTE — ED Triage Notes (Signed)
Pt c/o left chest pain which radiates down left arm x 1 week, denies n/v or SOB

## 2018-06-01 NOTE — ED Provider Notes (Signed)
[facility] EMERGENCY DEPARTMENT Provider Note   CSN: [id number] Arrival date & time: 06/01/18  1359     History   Chief Complaint Chief Complaint  Patient presents with  . Chest Pain    HPI Ronald Adams is a 72 y.o. male.  HPI Patient presents with 2-1/2 weeks of left shoulder and left upper chest pain.  States the pain is worse with movement especially trying to take off his shirt.  Denies any known trauma.  No shortness of breath.  No nausea or vomiting.  Patient states the pain is constant.  No new lower extremity swelling or pain.  No extended travel or immobilization. Past Medical History:  Diagnosis Date  . Hypertension     There are no active problems to display for this patient.   History reviewed. No pertinent surgical history.      Home Medications    Prior to Admission medications   Medication Sig Start Date End Date Taking? Authorizing Provider  HYDROcodone-acetaminophen (HYCET) 7.5-325 mg/15 ml solution Take 15 mLs by mouth every 4 (four) hours as needed for moderate pain or severe pain. 11/15/14   Mabe, Latanya MaudlinMartha L, MD  HYDROcodone-acetaminophen (NORCO/VICODIN) 5-325 MG per tablet Take 1-2 tablets by mouth every 6 (six) hours as needed for moderate pain. 01/26/15   Vanetta MuldersZackowski, Scott, MD  oxaprozin (DAYPRO) 600 MG tablet Take by mouth daily.    [provider]  penicillin v potassium (VEETID) 500 MG tablet Take 1 tablet (500 mg total) by mouth 3 (three) times daily. 11/15/14   Mabe, Latanya MaudlinMartha L, MD  polyethylene glycol (MIRALAX / GLYCOLAX) packet Take 17 g by mouth 3 (three) times daily as needed. 02/05/16   Harris, Cammy CopaAbigail, PA-C  predniSONE (DELTASONE) 50 MG tablet Take 1 tablet (50 mg total) by mouth daily. 11/15/14   Mabe, Latanya MaudlinMartha L, MD  sodium phosphate (FLEET) 7-19 GM/118ML ENEM Place 133 mLs (1 enema total) rectally once. 02/05/16   Arthor CaptainHarris, Abigail, PA-C    Family History History reviewed. No pertinent family history.  Social History Social  History   Tobacco Use  . Smoking status: Former Smoker    Types: Cigars  . Smokeless tobacco: Never Used  Substance Use Topics  . Alcohol use: Yes    Comment: occasional  . Drug use: No     Allergies   Patient has no known allergies.   Review of Systems Review of Systems  Constitutional: Negative for chills and fever.  HENT: Negative for congestion, sore throat and trouble swallowing.   Respiratory: Negative for cough and shortness of breath.   Cardiovascular: Positive for chest pain.  Gastrointestinal: Negative for abdominal pain, constipation, diarrhea, nausea and vomiting.  Genitourinary: Negative for dysuria, flank pain and hematuria.  Musculoskeletal: Positive for arthralgias, myalgias and neck pain. Negative for joint swelling and neck stiffness.  Skin: Negative for rash and wound.  Neurological: Negative for dizziness, weakness, light-headedness, numbness and headaches.     Physical Exam Updated Vital Signs BP (!) 150/80   Pulse 83   Temp 98.8 F (37.1 C)   Resp 14   Ht 5\' 8"  (1.727 m)   Wt 88.5 kg   SpO2 98%   BMI 29.65 kg/m   Physical Exam  Constitutional: He is oriented to person, place, and time. He appears well-developed and well-nourished. No distress.  HENT:  Head: Normocephalic and atraumatic.  Mouth/Throat: Oropharynx is clear and moist. No oropharyngeal exudate.  Eyes: Pupils are equal, round, and reactive to light. EOM are  normal.  Neck: Normal range of motion. Neck supple.  Midline posterior cervical tenderness to palpation.  Patient does have some left-sided trapezius tenderness.  Cardiovascular: Normal rate and regular rhythm. Exam reveals no gallop and no friction rub.  No murmur heard. Pulmonary/Chest: Effort normal and breath sounds normal. No stridor. No respiratory distress. He has no wheezes. He has no rales.  Abdominal: Soft. Bowel sounds are normal. There is no tenderness. There is no rebound and no guarding.  Musculoskeletal:  Normal range of motion. He exhibits tenderness. He exhibits no edema.  Crepitance with range of motion of the left shoulder.  No obvious erythema, warmth or deformity.  Patient does have tenderness to palpation of the left shoulder at the [facility] joint.  No midline thoracic or lumbar tenderness.  No lower extremity swelling, asymmetry or tenderness.  Distal pulses are 2+  Neurological: He is alert and oriented to person, place, and time.  Moves all extremities without focal deficit.  Sensation fully intact.  Skin: Skin is warm and dry. No rash noted. He is not diaphoretic. No erythema.  Psychiatric: He has a normal mood and affect. His behavior is normal.  Nursing note and vitals reviewed.    ED Treatments / Results  Labs (all labs ordered are listed, but only abnormal results are displayed) Labs Reviewed  CBC WITH DIFFERENTIAL/PLATELET - Abnormal; Notable for the following components:      Result Value   MCV 77.2 (*)    All other components within normal limits  COMPREHENSIVE METABOLIC PANEL - Abnormal; Notable for the following components:   Glucose, Bld 105 (*)    Calcium 8.8 (*)    All other components within normal limits  TROPONIN I    EKG EKG Interpretation  Date/Time:  Friday June 01 2018 14:06:42 EDT Ventricular Rate:  88 PR Interval:    QRS Duration: 149 QT Interval:  403 QTC Calculation: 488 R Axis:   -86 Text Interpretation:  Sinus rhythm RBBB and LAFB Confirmed by Loren Racer ([id number]) on 06/01/2018 3:02:35 PM   Radiology Dg Chest 2 View  Result Date: 06/01/2018 CLINICAL DATA:  Pt c/o left chest pain which radiates down left arm x 2 weeks, denies n/v or SOB. Hx of htn. EXAM: CHEST - 2 VIEW COMPARISON:  04/27/2017 FINDINGS: Lungs are clear. Heart size and mediastinal contours are within normal limits. Aortic Atherosclerosis (ICD10-170.0). No effusion. Anterior vertebral endplate spurring at multiple levels in the lower thoracic spine. IMPRESSION: No acute  cardiopulmonary disease. Electronically Signed   By: Corlis Leak M.D.   On: 06/01/2018 14:31   Dg Shoulder Left  Result Date: 06/01/2018 CLINICAL DATA:  Pt c/o left shoulder pain x 2 weeks. Denies injury. Pain is lateral. EXAM: LEFT SHOULDER - 2+ VIEW COMPARISON:  01/26/2015 FINDINGS: There is no evidence of fracture or dislocation. Spurring at the left acromioclavicular articulation. There is no evidence of arthropathy or other focal bone abnormality. Soft tissues are unremarkable. IMPRESSION: 1. Acromioclavicular spurring as before.  No acute findings. Electronically Signed   By: Corlis Leak M.D.   On: 06/01/2018 14:34    Procedures Procedures (including critical care time)  Medications Ordered in ED Medications - No data to display   Initial Impression / Assessment and Plan / ED Course  I have reviewed the triage vital signs and the nursing notes.  Pertinent labs & imaging results that were available during my care of the patient were reviewed by me and considered in my medical decision making (see  chart for details).     Patient's chest pain is atypical for coronary artery disease.  Single troponin given the 2 and half week history of constant chest pain is sufficient to rule out acute MI.  EKG without any acute findings compared to his previous EKG.  X-ray of left shoulder with AC spurring which is likely contributing to the patient's symptoms.  Will have follow-up with sports medicine.  Given patient age, though CAD unlikely for his presentation today, think it prudent to follow-up with cardiology.  Return precautions given.  Final Clinical Impressions(s) / ED Diagnoses   Final diagnoses:  Atypical chest pain  Chronic left shoulder pain    ED Discharge Orders    None       Loren RacerYelverton, Sonyia Muro, MD 06/01/18 1505

## 2018-11-28 ENCOUNTER — Emergency Department (HOSPITAL_BASED_OUTPATIENT_CLINIC_OR_DEPARTMENT_OTHER): Payer: Medicare HMO

## 2018-11-28 ENCOUNTER — Emergency Department (HOSPITAL_BASED_OUTPATIENT_CLINIC_OR_DEPARTMENT_OTHER)
Admission: EM | Admit: 2018-11-28 | Discharge: 2018-11-28 | Disposition: A | Discharge status: Home / Self Care | Payer: Medicare HMO | Attending: Emergency Medicine | Admitting: Emergency Medicine

## 2018-11-28 ENCOUNTER — Other Ambulatory Visit: Payer: Self-pay

## 2018-11-28 ENCOUNTER — Encounter (HOSPITAL_BASED_OUTPATIENT_CLINIC_OR_DEPARTMENT_OTHER): Payer: Self-pay

## 2018-11-28 DIAGNOSIS — I10 Essential (primary) hypertension: Secondary | ICD-10-CM | POA: Insufficient documentation

## 2018-11-28 DIAGNOSIS — Z87891 Personal history of nicotine dependence: Secondary | ICD-10-CM | POA: Insufficient documentation

## 2018-11-28 DIAGNOSIS — E119 Type 2 diabetes mellitus without complications: Secondary | ICD-10-CM | POA: Insufficient documentation

## 2018-11-28 DIAGNOSIS — R05 Cough: Secondary | ICD-10-CM | POA: Insufficient documentation

## 2018-11-28 DIAGNOSIS — R059 Cough, unspecified: Secondary | ICD-10-CM

## 2018-11-28 HISTORY — DX: Type 2 diabetes mellitus without complications: E11.9

## 2018-11-28 HISTORY — DX: Pure hypercholesterolemia, unspecified: E78.00

## 2018-11-28 MED ORDER — BENZONATATE 100 MG PO CAPS: ORAL_CAPSULE | ORAL | 0 refills | Status: AC

## 2018-11-28 MED FILL — BENZONATATE 100 MG CAP: 100 | 5 days supply | Qty: 30 | Fill #0

## 2018-11-28 NOTE — Discharge Instructions (Addendum)
You are seen in the ER today for a cough.  Your chest x-ray was normal.  We are sending him with Tessalon, a medicine to help with cough.  Please take this as prescribed.  We have prescribed you new medication(s) today. Discuss the medications prescribed today with your pharmacist as they can have adverse effects and interactions with your other medicines including over the counter and prescribed medications. Seek medical evaluation if you start to experience new or abnormal symptoms after taking one of these medicines, seek care immediately if you start to experience difficulty breathing, feeling of your throat closing, facial swelling, or rash as these could be indications of a more serious allergic reaction  Please follow-up with primary care within 3 days.  Return to the ER for new or worsening symptoms including but not limited to fever, worsened cough, trouble breathing , leg swelling, chest pain, coughing up blood or any other concerns.

## 2018-11-28 NOTE — ED Triage Notes (Signed)
c/o prod cough x 3 weeks-NAD-steady gait

## 2018-11-28 NOTE — ED Provider Notes (Signed)
[facility] EMERGENCY DEPARTMENT Provider Note   CSN: [id number] Arrival date & time: 11/28/18  1230     History   Chief Complaint Chief Complaint  Patient presents with  . Cough    HPI Ronald Adams is a 73 y.o. male with a hx of HTN, hypercholesterolemia, and T2DM who presents to the emergency department with complaints of cough for the past 2 weeks.  Patient describes the cough as being productive with white to clear phlegm sputum.  No specific alleviating or aggravating factors.  He states he tried multiple over-the-counter cough medications without relief.  Denies fever, chills, purulent rhinorrhea, sinus pain/pressure, sore throat, chest pain, or dyspnea.  Deniesh History of tobacco use. Denies leg swelling, orthopnea, or PND.   HPI  Past Medical History:  Diagnosis Date  . Diabetes mellitus without complication (HCC)   . High cholesterol   . Hypertension     There are no active problems to display for this patient.   History reviewed. No pertinent surgical history.      Home Medications    Prior to Admission medications   Medication Sig Start Date End Date Taking? Authorizing Provider  HYDROcodone-acetaminophen (HYCET) 7.5-325 mg/15 ml solution Take 15 mLs by mouth every 4 (four) hours as needed for moderate pain or severe pain. 11/15/14   Mabe, Latanya Maudlin, MD  HYDROcodone-acetaminophen (NORCO/VICODIN) 5-325 MG per tablet Take 1-2 tablets by mouth every 6 (six) hours as needed for moderate pain. 01/26/15   Vanetta Mulders, MD  oxaprozin (DAYPRO) 600 MG tablet Take by mouth daily.    [provider]  penicillin v potassium (VEETID) 500 MG tablet Take 1 tablet (500 mg total) by mouth 3 (three) times daily. 11/15/14   Mabe, Latanya Maudlin, MD  polyethylene glycol (MIRALAX / GLYCOLAX) packet Take 17 g by mouth 3 (three) times daily as needed. 02/05/16   Harris, Cammy Copa, PA-C  predniSONE (DELTASONE) 50 MG tablet Take 1 tablet (50 mg total) by mouth daily. 11/15/14    Mabe, Latanya Maudlin, MD  sodium phosphate (FLEET) 7-19 GM/118ML ENEM Place 133 mLs (1 enema total) rectally once. 02/05/16   Arthor Captain, PA-C    Family History No family history on file.  Social History Social History   Tobacco Use  . Smoking status: Former Smoker    Types: Cigars  . Smokeless tobacco: Never Used  Substance Use Topics  . Alcohol use: Yes    Comment: occasional  . Drug use: No     Allergies   Patient has no known allergies.   Review of Systems Review of Systems  Constitutional: Negative for chills and fever.  HENT: Negative for ear pain, rhinorrhea, sore throat and voice change.   Respiratory: Positive for cough. Negative for shortness of breath.        No hemoptysis.   Cardiovascular: Negative for chest pain and leg swelling.  Gastrointestinal: Negative for vomiting.     Physical Exam Updated Vital Signs BP (!) 145/79 (BP Location: Right Arm)   Pulse 66   Temp 98.5 F (36.9 C) (Oral)   Resp 18   Ht 5\' 8"  (1.727 m)   Wt 93.9 kg   SpO2 96%   BMI 31.48 kg/m   Physical Exam Vitals signs and nursing note reviewed.  Constitutional:      General: He is not in acute distress.    Appearance: He is well-developed. He is not toxic-appearing.  HENT:     Head: Normocephalic and atraumatic.  Right Ear: Tympanic membrane normal.     Left Ear: Tympanic membrane normal.     Nose: No rhinorrhea.     Comments: No sinus tenderness    Mouth/Throat:     Mouth: Mucous membranes are moist.     Pharynx: Oropharynx is clear. No oropharyngeal exudate or posterior oropharyngeal erythema.  Eyes:     General:        Right eye: No discharge.        Left eye: No discharge.     Conjunctiva/sclera: Conjunctivae normal.  Neck:     Musculoskeletal: Neck supple.  Cardiovascular:     Rate and Rhythm: Normal rate and regular rhythm.  Pulmonary:     Effort: Pulmonary effort is normal. No respiratory distress.     Breath sounds: Normal breath sounds. No stridor.  No wheezing, rhonchi or rales.  Abdominal:     General: There is no distension.     Palpations: Abdomen is soft.     Tenderness: There is no abdominal tenderness.  Musculoskeletal:     Right lower leg: No edema.     Left lower leg: No edema.  Skin:    General: Skin is warm and dry.     Findings: No rash.  Neurological:     Mental Status: He is alert.     Comments: Clear speech.   Psychiatric:        Behavior: Behavior normal.      ED Treatments / Results  Labs (all labs ordered are listed, but only abnormal results are displayed) Labs Reviewed - No data to display  EKG None  Radiology Dg Chest 2 View  Result Date: 11/28/2018 CLINICAL DATA:  Cough EXAM: CHEST - 2 VIEW COMPARISON:  June 01, 2018 FINDINGS: There is no evident edema or consolidation. Heart size and pulmonary vascularity are normal. No adenopathy. There is degenerative change in the thoracic spine. IMPRESSION: No edema or consolidation. Electronically Signed   By: Bretta Bang III M.D.   On: 11/28/2018 13:40    Procedures Procedures (including critical care time)  Medications Ordered in ED Medications - No data to display   Initial Impression / Assessment and Plan / ED Course  I have reviewed the triage vital signs and the nursing notes.  Pertinent labs & imaging results that were available during my care of the patient were reviewed by me and considered in my medical decision making (see chart for details).   Patient presents to the emergency department with complaints of cough for the past 2 weeks.  Patient is nontoxic-appearing, no apparent distress, vitals WNL other than mildly elevated blood pressure, doubt HTN emergency.  His lungs are clear to auscultation bilaterally, chest x-ray negative for infiltrate, afebrile, doubt pneumonia.  No evidence of respiratory distress or wheezing to necessitate steroids or neb treatments. No signs of fluid overload on CXR, LE edema, or sxs to raise concern for  CHF.  No sinus tenderness or purulent rhinorrhea to suggest acute bacterial sinusitis necessitating antibiotics.  At this time suspect viral, possibly allergic with weather changes recently.  Will trial Tessalon with PCP follow-up. I discussed results, treatment plan, need for follow-up, and return precautions with the patient. Provided opportunity for questions, patient confirmed understanding and is in agreement with plan.   Findings and plan of care discussed with supervising physician Dr. Rush Landmark- in agreement.   Final Clinical Impressions(s) / ED Diagnoses   Final diagnoses:  Cough    ED Discharge Orders  Ordered    benzonatate (TESSALON) 100 MG capsule     11/28/18 1426           Adylin Hankey, HartfordSamantha R, PA-C 11/28/18 1428    Tegeler, Canary Brimhristopher J, MD 11/28/18 1655

## 2019-08-23 IMAGING — DX DG CHEST 2V
2 series · 2 of 2 positions shown · non-contrast
Comparison: 04/27/2017

CLINICAL DATA: Pt c/o left chest pain which radiates down left arm
x 2 weeks, denies n/v or SOB. Hx of htn.

EXAM:
CHEST - 2 VIEW

[chest pa]
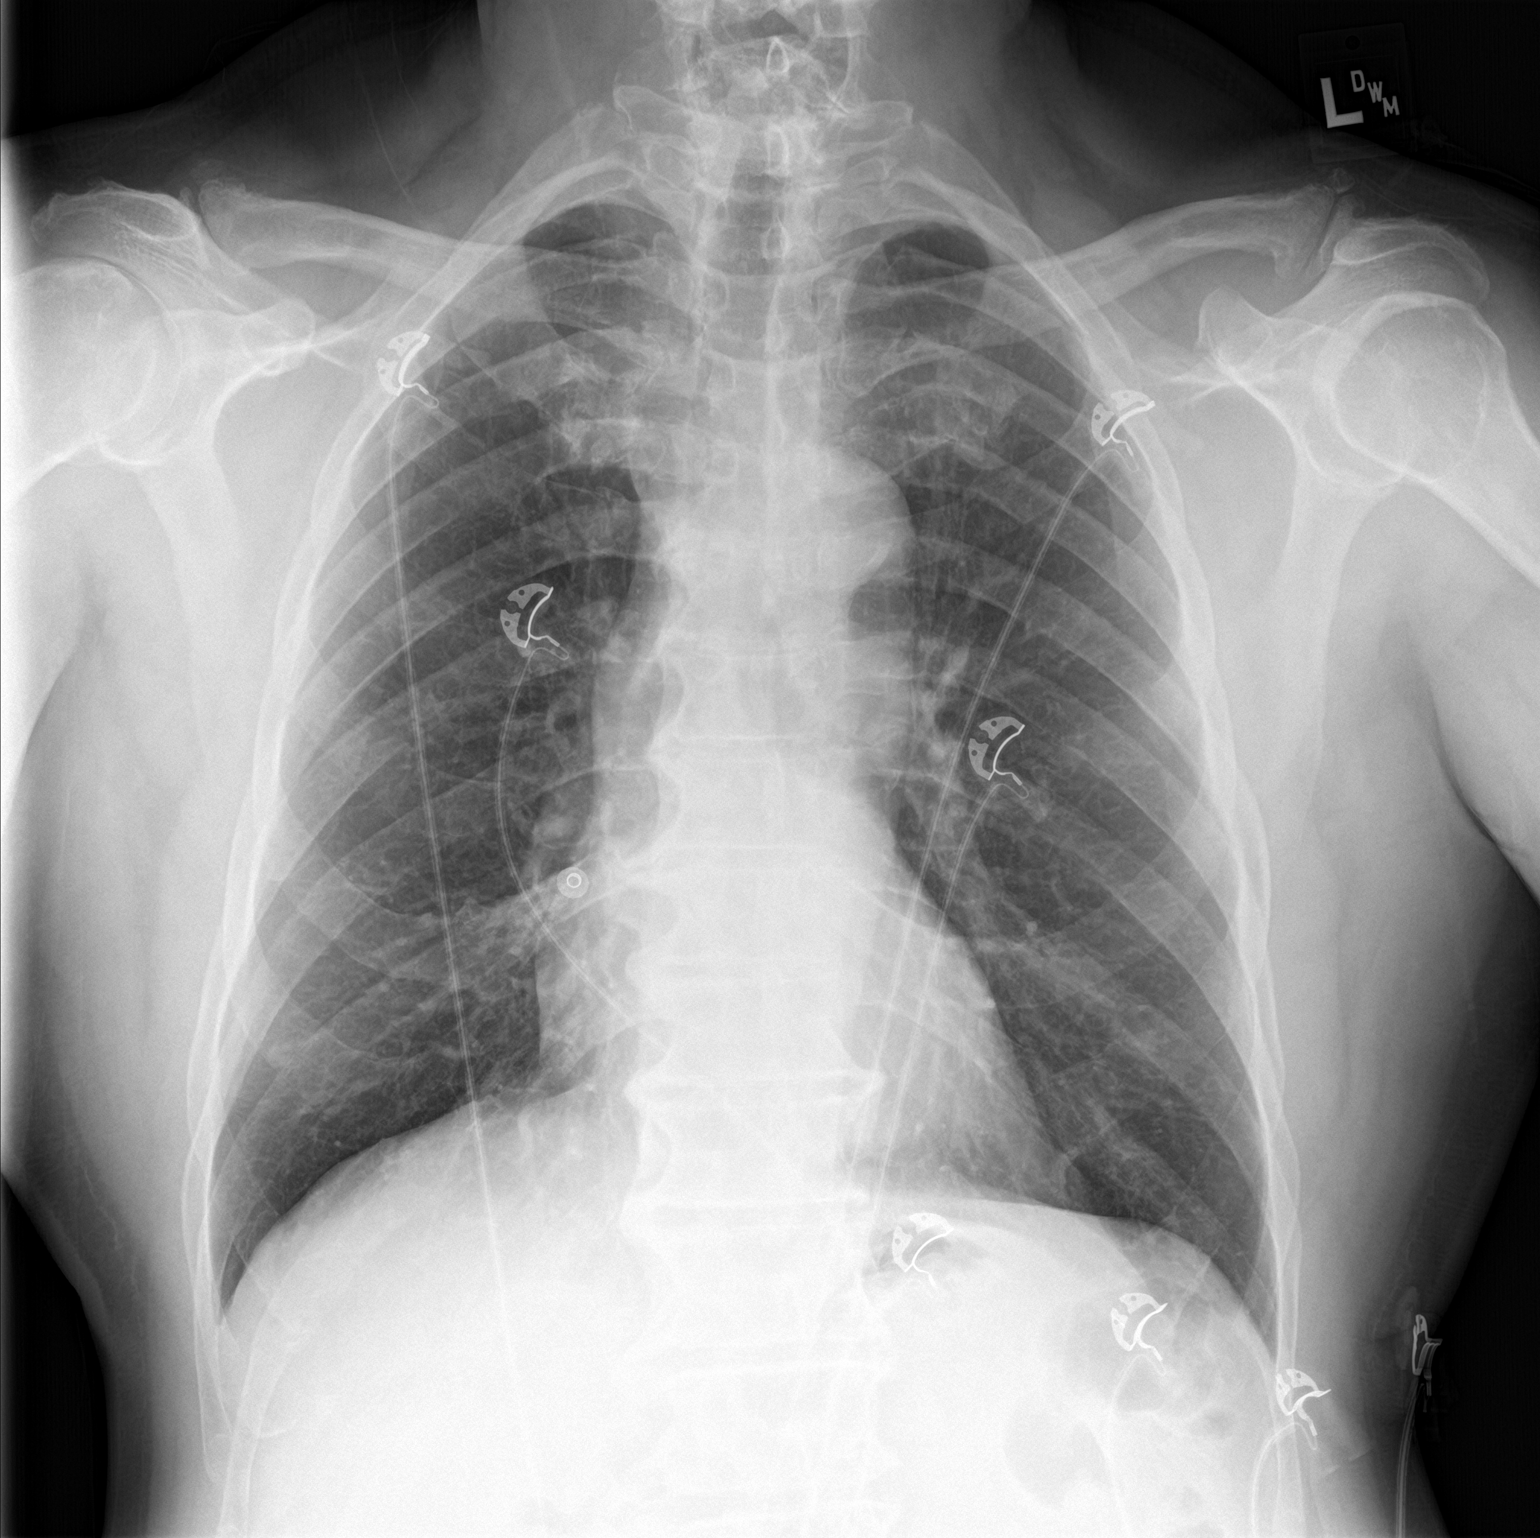

[chest lat]
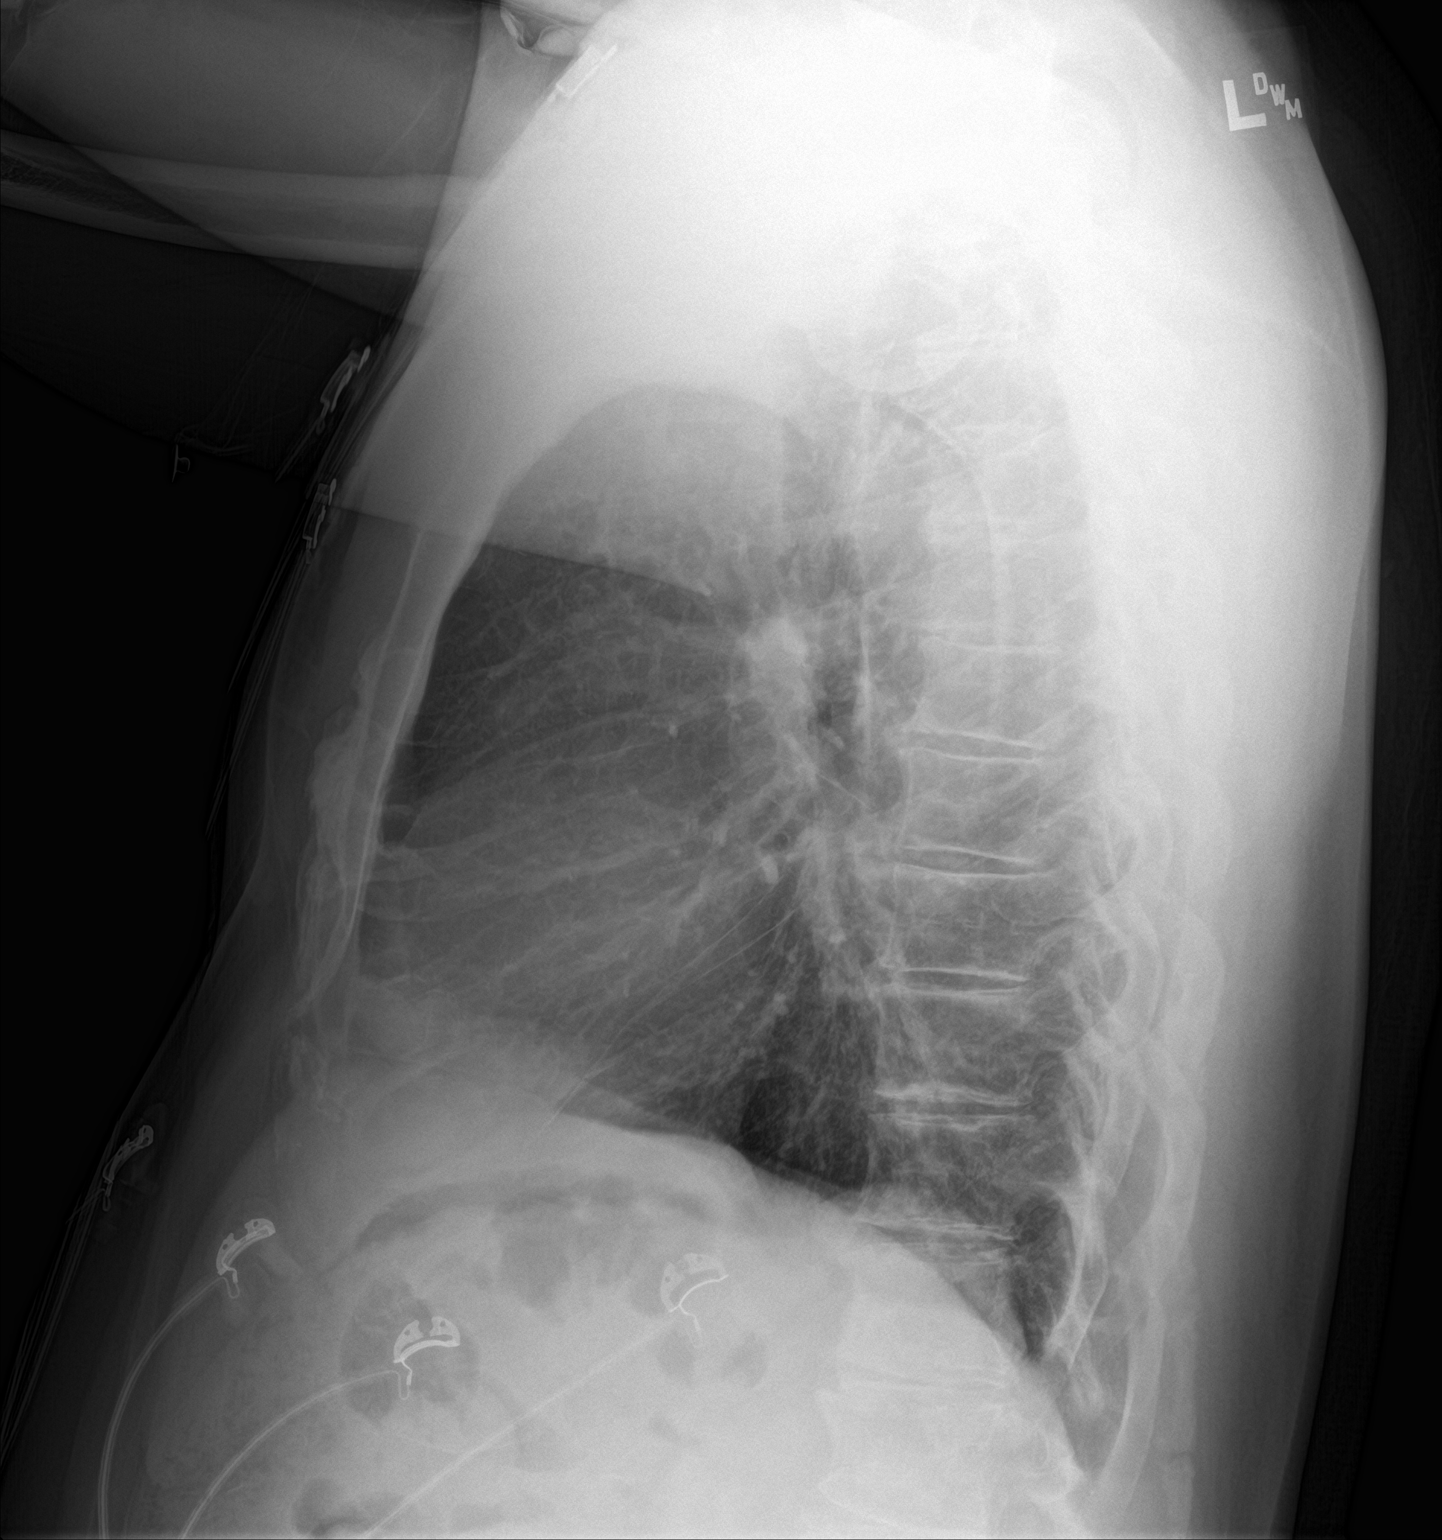

[2 of 2 positions shown; findings below may reference images not displayed]

FINDINGS: Lungs are clear.

Heart size and mediastinal contours are within normal limits. Aortic
Atherosclerosis ([id number]-170.0).

No effusion.

Anterior vertebral endplate spurring at multiple levels in the lower
thoracic spine.
IMPRESSION: No acute cardiopulmonary disease.

## 2020-02-19 IMAGING — CR DG CHEST 2V
2 series · 2 of 2 positions shown · non-contrast
Comparison: June 01, 2018

CLINICAL DATA: Cough

EXAM:
CHEST - 2 VIEW

[w chest pa]
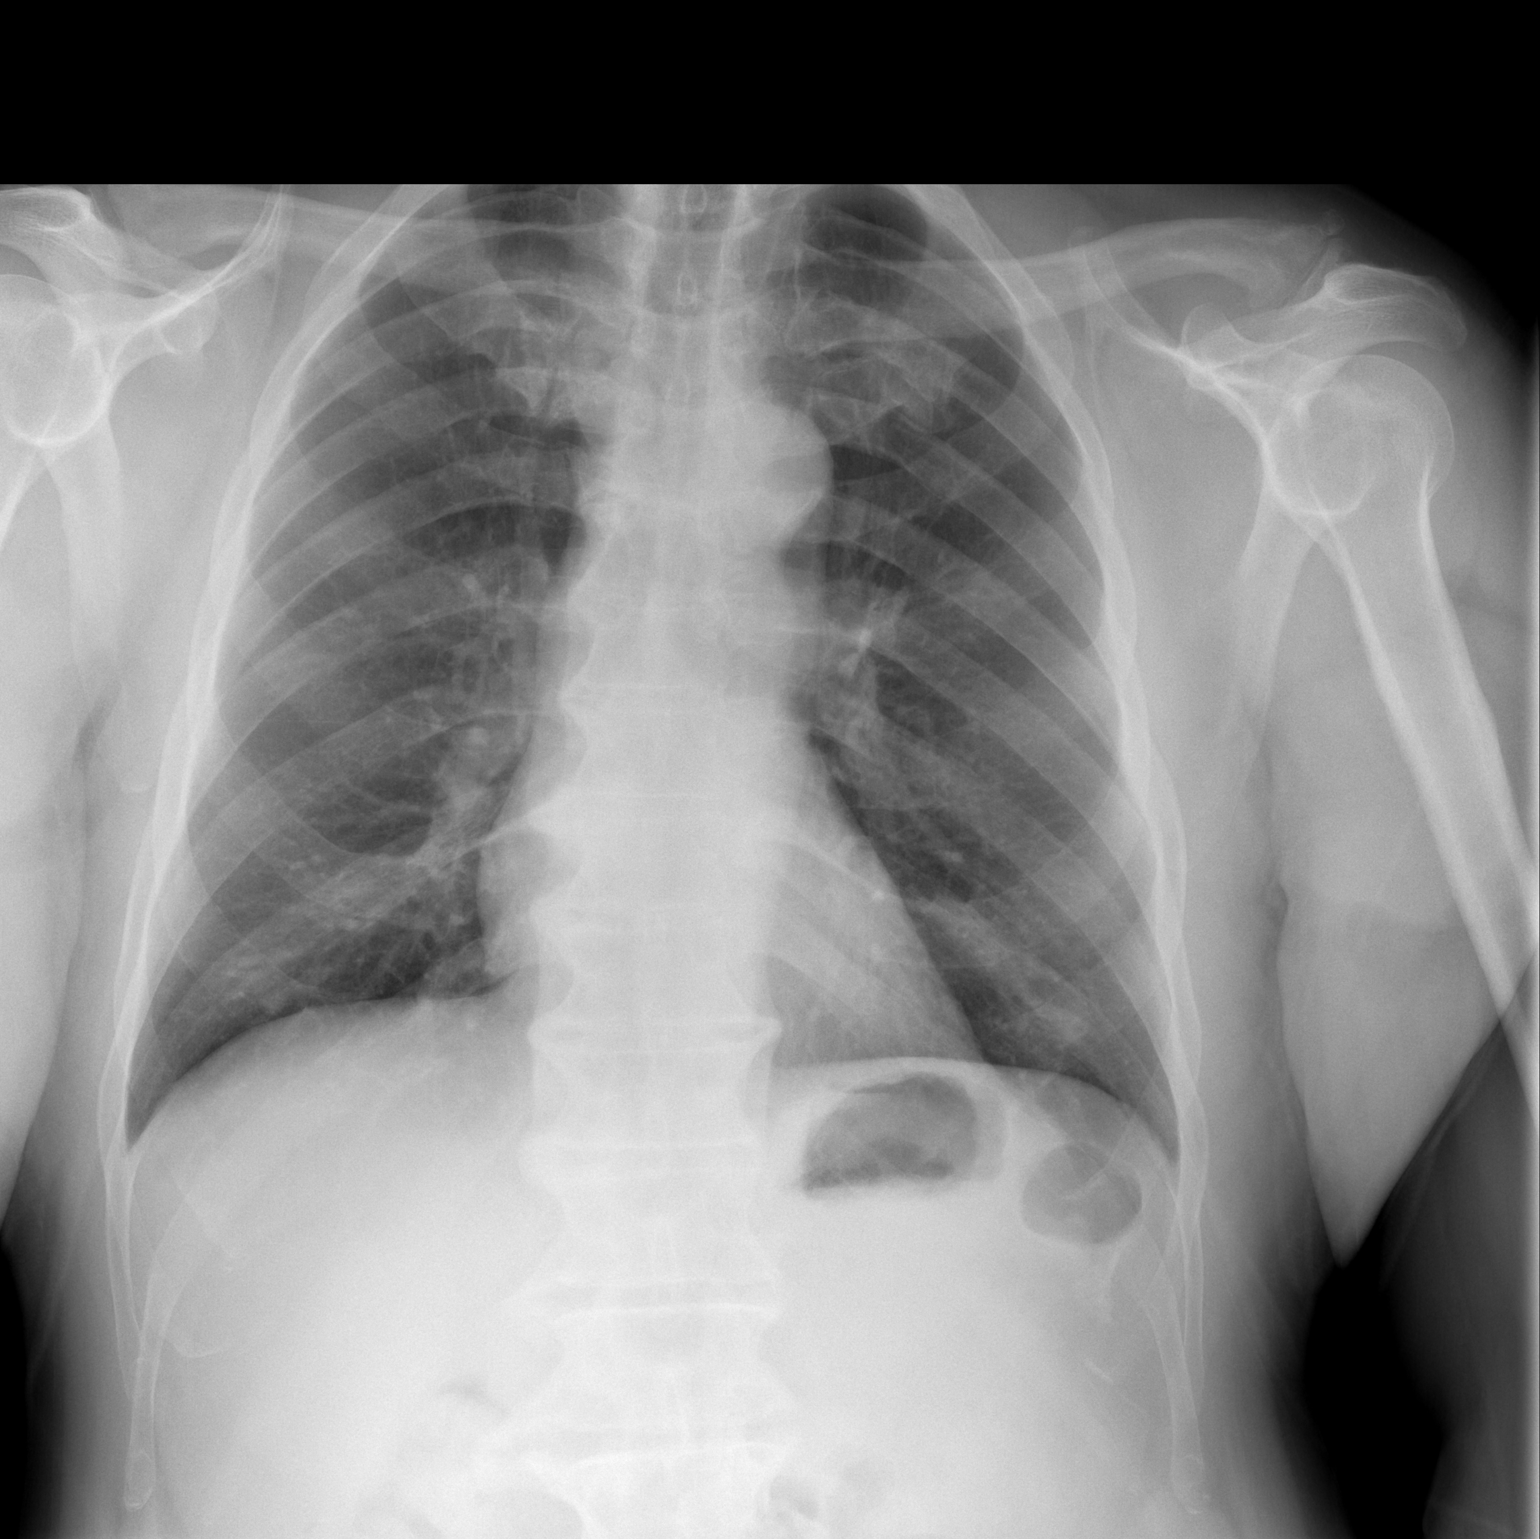

[w chest lat]
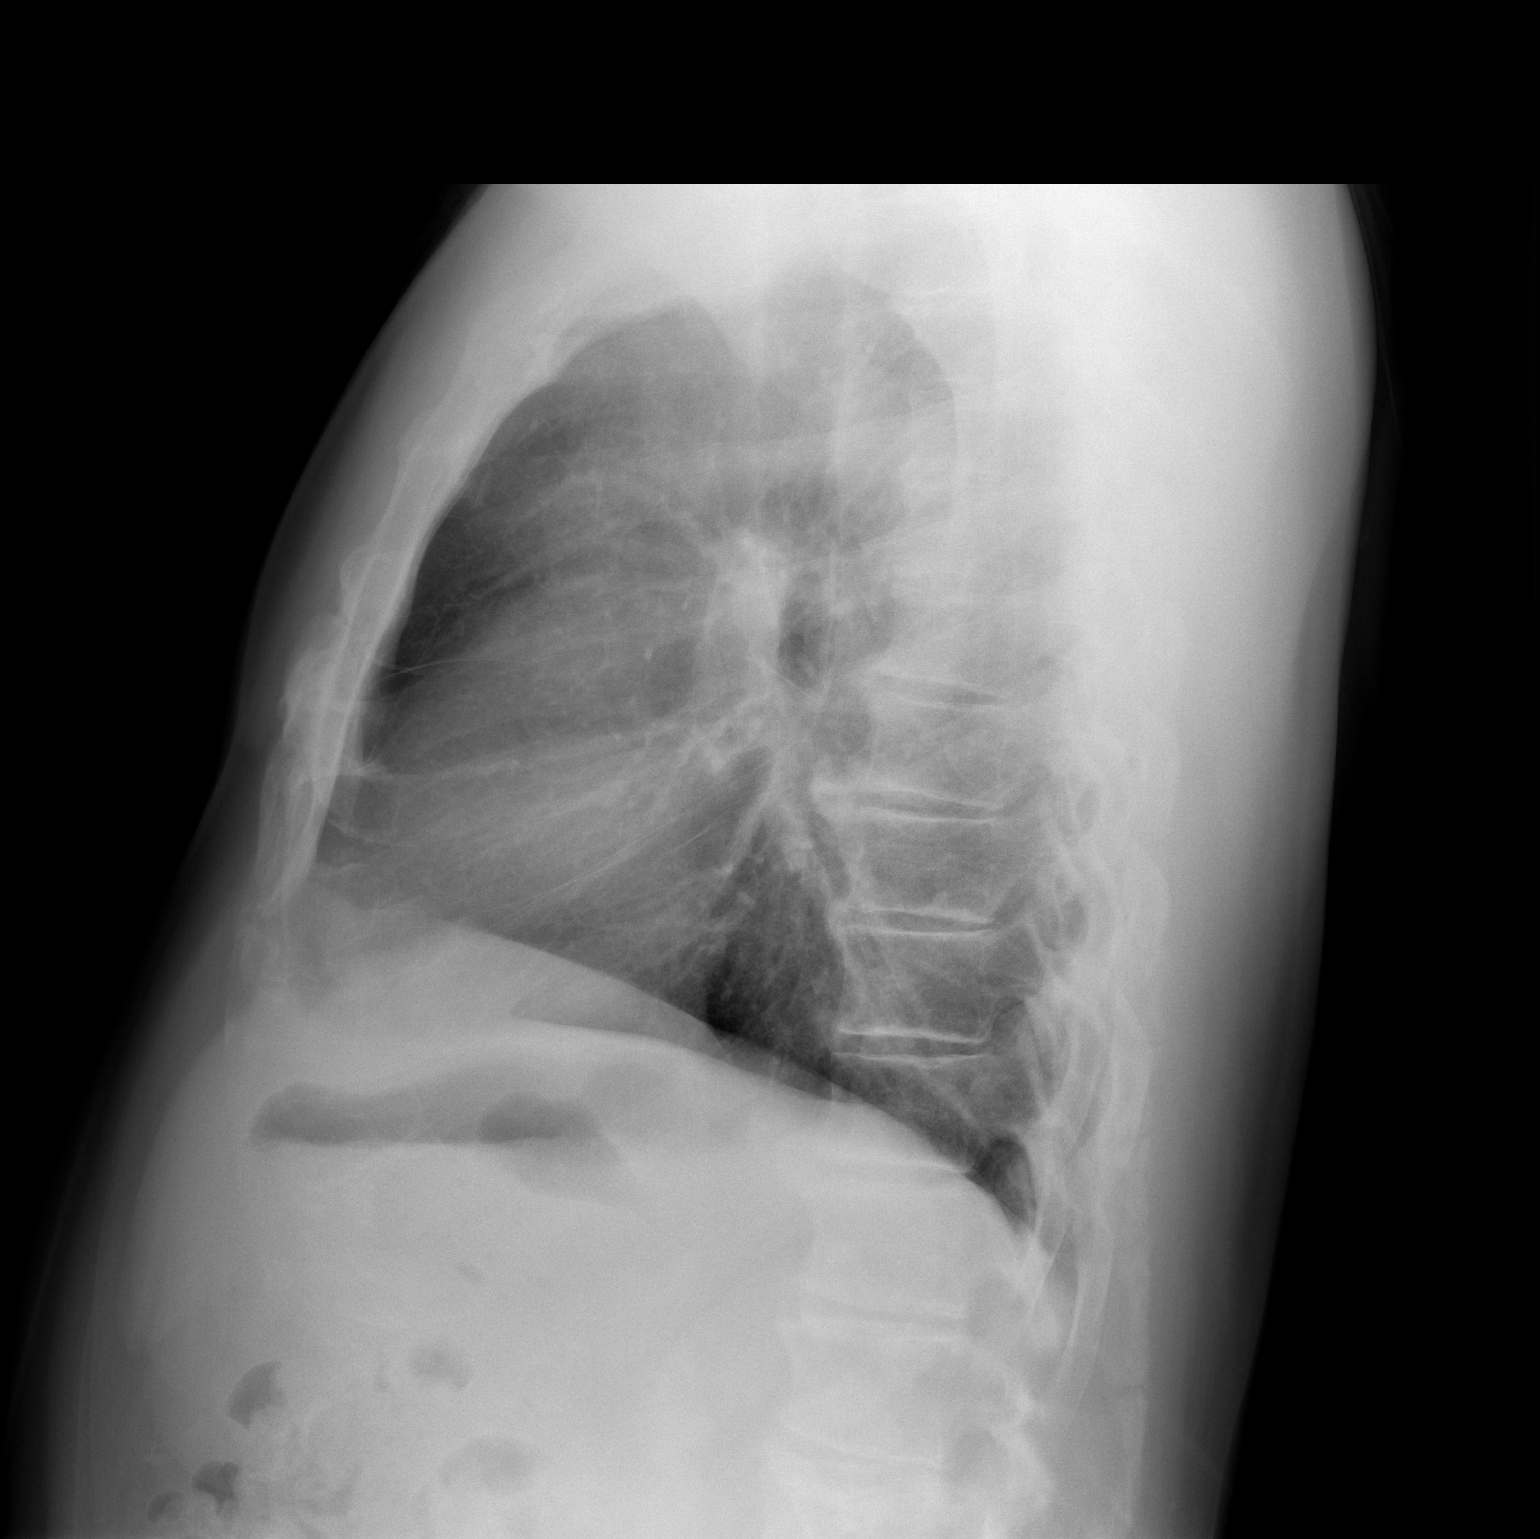

[2 of 2 positions shown; findings below may reference images not displayed]

FINDINGS: There is no evident edema or consolidation. Heart size and pulmonary
vascularity are normal. No adenopathy. There is degenerative change
in the thoracic spine.
IMPRESSION: No edema or consolidation.

## 2020-03-10 ENCOUNTER — Encounter (HOSPITAL_BASED_OUTPATIENT_CLINIC_OR_DEPARTMENT_OTHER): Payer: Self-pay | Admitting: Emergency Medicine

## 2020-03-10 ENCOUNTER — Emergency Department (HOSPITAL_BASED_OUTPATIENT_CLINIC_OR_DEPARTMENT_OTHER)
Admission: EM | Admit: 2020-03-10 | Discharge: 2020-03-10 | Disposition: A | Discharge status: Home / Self Care | Payer: Medicare HMO | Attending: Emergency Medicine | Admitting: Emergency Medicine

## 2020-03-10 ENCOUNTER — Other Ambulatory Visit: Payer: Self-pay

## 2020-03-10 DIAGNOSIS — R21 Rash and other nonspecific skin eruption: Secondary | ICD-10-CM | POA: Insufficient documentation

## 2020-03-10 DIAGNOSIS — Z79899 Other long term (current) drug therapy: Secondary | ICD-10-CM | POA: Insufficient documentation

## 2020-03-10 DIAGNOSIS — I10 Essential (primary) hypertension: Secondary | ICD-10-CM | POA: Insufficient documentation

## 2020-03-10 DIAGNOSIS — Z87891 Personal history of nicotine dependence: Secondary | ICD-10-CM | POA: Diagnosis not present

## 2020-03-10 DIAGNOSIS — E119 Type 2 diabetes mellitus without complications: Secondary | ICD-10-CM | POA: Diagnosis not present

## 2020-03-10 MED ORDER — HYDROXYZINE HCL 25 MG PO TABS: 25.0000 mg | ORAL_TABLET | Freq: Four times a day (QID) | ORAL | 0 refills | Status: AC

## 2020-03-10 NOTE — Discharge Instructions (Signed)
Your rash to me is most consistent with insect bites.  Please wear bug repellent when you go outside.  Continue using your cortisone cream and you can continue using your other topical therapy.  I prescribed you medication that can help with itching but it can make you dizzy as well.  Please be careful when you take it.

## 2020-03-10 NOTE — ED Provider Notes (Signed)
[facility] EMERGENCY DEPARTMENT Provider Note   CSN: [id number] Arrival date & time: 03/10/20  1128     History Chief Complaint  Patient presents with  . Rash    Ronald Adams is a 74 y.o. male.  74 yo M with a chief complaints of a rash to the bilateral lower extremities.  Going on for a few days.  Actually things getting a little bit better.  Pruritic.  Has been applying some topical creams to it with improvement.  Not on the hands arms torso.  No fevers.  No nausea vomiting or diarrhea.  No injury.   The history is provided by the patient.  Rash Location:  Leg Leg rash location:  L leg and R leg Quality: itchiness   Severity:  Moderate Onset quality:  Gradual Duration:  2 days Timing:  Constant Progression:  Improving Chronicity:  New Relieved by:  Nothing Worsened by:  Nothing Ineffective treatments:  None tried Associated symptoms: no abdominal pain, no diarrhea, no fever, no headaches, no joint pain, no myalgias, no shortness of breath and not vomiting        Past Medical History:  Diagnosis Date  . Diabetes mellitus without complication (HCC)   . High cholesterol   . Hypertension     There are no problems to display for this patient.   History reviewed. No pertinent surgical history.     No family history on file.  Social History   Tobacco Use  . Smoking status: Former Smoker    Types: Cigars  . Smokeless tobacco: Never Used  Substance Use Topics  . Alcohol use: Yes    Comment: occasional  . Drug use: No    Home Medications Prior to Admission medications   Medication Sig Start Date End Date Taking? Authorizing Provider  benzonatate (TESSALON) 100 MG capsule Take 1-2 tablets every 8 hour as needed for coughing. 11/28/18   Petrucelli, Pleas Koch, PA-C  HYDROcodone-acetaminophen (HYCET) 7.5-325 mg/15 ml solution Take 15 mLs by mouth every 4 (four) hours as needed for moderate pain or severe pain. 11/15/14   Mabe, Latanya Maudlin, MD    HYDROcodone-acetaminophen (NORCO/VICODIN) 5-325 MG per tablet Take 1-2 tablets by mouth every 6 (six) hours as needed for moderate pain. 01/26/15   Vanetta Mulders, MD  hydrOXYzine (ATARAX/VISTARIL) 25 MG tablet Take 1 tablet (25 mg total) by mouth every 6 (six) hours. 03/10/20   Melene Plan, DO  oxaprozin (DAYPRO) 600 MG tablet Take by mouth daily.    [provider]  penicillin v potassium (VEETID) 500 MG tablet Take 1 tablet (500 mg total) by mouth 3 (three) times daily. 11/15/14   Mabe, Latanya Maudlin, MD  polyethylene glycol (MIRALAX / GLYCOLAX) packet Take 17 g by mouth 3 (three) times daily as needed. 02/05/16   Harris, Cammy Copa, PA-C  predniSONE (DELTASONE) 50 MG tablet Take 1 tablet (50 mg total) by mouth daily. 11/15/14   Mabe, Latanya Maudlin, MD  sodium phosphate (FLEET) 7-19 GM/118ML ENEM Place 133 mLs (1 enema total) rectally once. 02/05/16   Arthor Captain, PA-C    Allergies    Patient has no known allergies.  Review of Systems   Review of Systems  Constitutional: Negative for chills and fever.  HENT: Negative for congestion and facial swelling.   Eyes: Negative for discharge and visual disturbance.  Respiratory: Negative for shortness of breath.   Cardiovascular: Negative for chest pain and palpitations.  Gastrointestinal: Negative for abdominal pain, diarrhea and vomiting.  Musculoskeletal: Negative  for arthralgias and myalgias.  Skin: Positive for rash. Negative for color change.  Neurological: Negative for tremors, syncope and headaches.  Psychiatric/Behavioral: Negative for confusion and dysphoric mood.    Physical Exam Updated Vital Signs BP (!) 145/77 (BP Location: Right Arm)   Pulse 74   Temp 98.8 F (37.1 C) (Oral)   Resp 18   Ht 5\' 9"  (1.753 m)   Wt 88.5 kg   SpO2 96%   BMI 28.80 kg/m   Physical Exam Vitals and nursing note reviewed.  Constitutional:      Appearance: He is well-developed.  HENT:     Head: Normocephalic and atraumatic.  Eyes:     Pupils:  Pupils are equal, round, and reactive to light.  Neck:     Vascular: No JVD.  Cardiovascular:     Rate and Rhythm: Normal rate and regular rhythm.     Heart sounds: No murmur. No friction rub. No gallop.   Pulmonary:     Effort: No respiratory distress.     Breath sounds: No wheezing.  Abdominal:     General: There is no distension.     Tenderness: There is no guarding or rebound.  Musculoskeletal:        General: Normal range of motion.     Cervical back: Normal range of motion and neck supple.     Comments: Scattered papules. From just above the knee down  Skin:    Coloration: Skin is not pale.     Findings: No rash.  Neurological:     Mental Status: He is alert and oriented to person, place, and time.  Psychiatric:        Behavior: Behavior normal.     ED Results / Procedures / Treatments   Labs (all labs ordered are listed, but only abnormal results are displayed) Labs Reviewed - No data to display  EKG None  Radiology No results found.  Procedures Procedures (including critical care time)  Medications Ordered in ED Medications - No data to display  ED Course  I have reviewed the triage vital signs and the nursing notes.  Pertinent labs & imaging results that were available during my care of the patient were reviewed by me and considered in my medical decision making (see chart for details).    MDM Rules/Calculators/A&P                      74 yo M with a cc of rash to BLE.  Is most consistent with isnsect bites.  We will have the patient continue topical therapy.  PCP follow-up.  12:51 PM:  I have discussed the diagnosis/risks/treatment options with the patient and believe the pt to be eligible for discharge home to follow-up with PCP. We also discussed returning to the ED immediately if new or worsening sx occur. We discussed the sx which are most concerning (e.g., rapid spreading rash, fever) that necessitate immediate return. Medications administered to  the patient during their visit and any new prescriptions provided to the patient are listed below.  Medications given during this visit Medications - No data to display   The patient appears reasonably screen and/or stabilized for discharge and I doubt any other medical condition or other [facility] requiring further screening, evaluation, or treatment in the ED at this time prior to discharge.   Final Clinical Impression(s) / ED Diagnoses Final diagnoses:  Rash    Rx / DC Orders ED Discharge Orders  Ordered    hydrOXYzine (ATARAX/VISTARIL) 25 MG tablet  Every 6 hours     03/10/20 1245           Melene Plan, DO 03/10/20 1251

## 2020-03-10 NOTE — ED Triage Notes (Signed)
Pt c/o itchy rash from waistline to feet x 1 week
